# Patient Record
Sex: Female | Born: 1985 | Race: Black or African American | Hispanic: No | Marital: Single | Smoking: Current every day smoker
Health system: Southern US, Community
[De-identification: ages and names within clinical notes are randomized; demographics above are authoritative.]

## PROBLEM LIST (undated history)

## (undated) DIAGNOSIS — I10 Essential (primary) hypertension: Secondary | ICD-10-CM

## (undated) DIAGNOSIS — C50919 Malignant neoplasm of unspecified site of unspecified female breast: Secondary | ICD-10-CM

## (undated) DIAGNOSIS — I639 Cerebral infarction, unspecified: Secondary | ICD-10-CM

## (undated) HISTORY — PX: PORTACATH PLACEMENT: SHX2246

## (undated) HISTORY — PX: ABDOMINAL HYSTERECTOMY: SHX81

---

## 2017-08-10 ENCOUNTER — Emergency Department
Admission: EM | Admit: 2017-08-10 | Discharge: 2017-08-10 | Disposition: A | Discharge status: Home / Self Care | Payer: Medicaid - Out of State | Attending: Emergency Medicine | Admitting: Emergency Medicine

## 2017-08-10 ENCOUNTER — Encounter: Payer: Self-pay | Admitting: Emergency Medicine

## 2017-08-10 ENCOUNTER — Emergency Department: Payer: Medicaid - Out of State

## 2017-08-10 ENCOUNTER — Other Ambulatory Visit: Payer: Self-pay

## 2017-08-10 DIAGNOSIS — Z853 Personal history of malignant neoplasm of breast: Secondary | ICD-10-CM | POA: Diagnosis not present

## 2017-08-10 DIAGNOSIS — N3001 Acute cystitis with hematuria: Secondary | ICD-10-CM | POA: Diagnosis not present

## 2017-08-10 DIAGNOSIS — C50919 Malignant neoplasm of unspecified site of unspecified female breast: Secondary | ICD-10-CM | POA: Insufficient documentation

## 2017-08-10 DIAGNOSIS — F17210 Nicotine dependence, cigarettes, uncomplicated: Secondary | ICD-10-CM | POA: Insufficient documentation

## 2017-08-10 DIAGNOSIS — R531 Weakness: Secondary | ICD-10-CM | POA: Diagnosis present

## 2017-08-10 HISTORY — DX: Malignant neoplasm of unspecified site of unspecified female breast: C50.919

## 2017-08-10 LAB — DIFFERENTIAL
Basophils Absolute: 0 10*3/uL (ref 0–0.1)
Basophils Relative: 0 %
Eosinophils Absolute: 0.2 10*3/uL (ref 0–0.7)
Eosinophils Relative: 1 %
LYMPHS ABS: 1.2 10*3/uL (ref 1.0–3.6)
LYMPHS PCT: 11 %
MONO ABS: 0.2 10*3/uL (ref 0.2–0.9)
Monocytes Relative: 2 %
NEUTROS PCT: 86 %
Neutro Abs: 9.9 10*3/uL — ABNORMAL HIGH (ref 1.4–6.5)

## 2017-08-10 LAB — URINALYSIS, COMPLETE (UACMP) WITH MICROSCOPIC
Bacteria, UA: NONE SEEN
Bilirubin Urine: NEGATIVE
Glucose, UA: NEGATIVE mg/dL
Ketones, ur: NEGATIVE mg/dL
Nitrite: NEGATIVE
PH: 7 (ref 5.0–8.0)
Protein, ur: NEGATIVE mg/dL
SPECIFIC GRAVITY, URINE: 1.02 (ref 1.005–1.030)

## 2017-08-10 LAB — CBC
HCT: 30.6 % — ABNORMAL LOW (ref 35.0–47.0)
Hemoglobin: 10 g/dL — ABNORMAL LOW (ref 12.0–16.0)
MCH: 27.2 pg (ref 26.0–34.0)
MCHC: 32.7 g/dL (ref 32.0–36.0)
MCV: 83 fL (ref 80.0–100.0)
PLATELETS: 191 10*3/uL (ref 150–440)
RBC: 3.69 MIL/uL — ABNORMAL LOW (ref 3.80–5.20)
RDW: 18.6 % — AB (ref 11.5–14.5)
WBC: 11.6 10*3/uL — AB (ref 3.6–11.0)

## 2017-08-10 LAB — COMPREHENSIVE METABOLIC PANEL
ALBUMIN: 4.1 g/dL (ref 3.5–5.0)
ALK PHOS: 68 U/L (ref 38–126)
ALT: 14 U/L (ref 14–54)
AST: 20 U/L (ref 15–41)
Anion gap: 8 (ref 5–15)
BUN: 13 mg/dL (ref 6–20)
CALCIUM: 9.1 mg/dL (ref 8.9–10.3)
CO2: 24 mmol/L (ref 22–32)
Chloride: 106 mmol/L (ref 101–111)
Creatinine, Ser: 0.53 mg/dL (ref 0.44–1.00)
GFR calc Af Amer: >60 mL/min (ref >60)
GFR calc non Af Amer: >60 mL/min (ref >60)
GLUCOSE: 100 mg/dL — AB (ref 65–99)
Potassium: 3.5 mmol/L (ref 3.5–5.1)
Sodium: 138 mmol/L (ref 135–145)
Total Bilirubin: 0.8 mg/dL (ref 0.3–1.2)
Total Protein: 7 g/dL (ref 6.5–8.1)

## 2017-08-10 LAB — POCT PREGNANCY, URINE: Preg Test, Ur: NEGATIVE

## 2017-08-10 LAB — INFLUENZA PANEL BY PCR (TYPE A & B)
Influenza A By PCR: NEGATIVE
Influenza B By PCR: NEGATIVE

## 2017-08-10 MED ORDER — OXYCODONE-ACETAMINOPHEN 5-325 MG PO TABS: 1.0000 | ORAL_TABLET | Freq: Four times a day (QID) | ORAL | 0 refills | Status: AC | PRN

## 2017-08-10 MED ORDER — CEPHALEXIN 500 MG PO CAPS: 500.0000 mg | ORAL_CAPSULE | Freq: Three times a day (TID) | ORAL | 0 refills | Status: AC

## 2017-08-10 MED ORDER — OXYCODONE HCL 5 MG PO TABS
5.0000 mg | ORAL_TABLET | Freq: Once | ORAL | Status: AC
  Administered 2017-08-10: 5 mg via ORAL
  Filled 2017-08-10: qty 1

## 2017-08-10 MED ORDER — SODIUM CHLORIDE 0.9 % IV BOLUS (SEPSIS)
500.0000 mL | Freq: Once | INTRAVENOUS | Status: AC
  Administered 2017-08-10: 500 mL via INTRAVENOUS

## 2017-08-10 MED ORDER — ACETAMINOPHEN 500 MG PO TABS
1000.0000 mg | ORAL_TABLET | Freq: Once | ORAL | Status: DC
  Filled 2017-08-10: qty 2

## 2017-08-10 MED ORDER — KETOROLAC TROMETHAMINE 30 MG/ML IJ SOLN
15.0000 mg | Freq: Once | INTRAMUSCULAR | Status: AC
  Administered 2017-08-10: 15 mg via INTRAVENOUS
  Filled 2017-08-10: qty 1

## 2017-08-10 MED ORDER — CEFTRIAXONE SODIUM IN DEXTROSE 20 MG/ML IV SOLN
1.0000 g | Freq: Once | INTRAVENOUS | Status: AC
  Administered 2017-08-10: 1 g via INTRAVENOUS
  Filled 2017-08-10: qty 50

## 2017-08-10 NOTE — ED Provider Notes (Signed)
Not neutropenic.  Discharged according to Dr. Doristine Johns plan.   Darel Hong, MD 08/10/17 2045

## 2017-08-10 NOTE — ED Notes (Signed)
Pt reports being a 10/10 pain. No distress noted. Pt states her abdomen is hurting now. Advised pt not to eat anything else. MD updated.

## 2017-08-10 NOTE — ED Notes (Signed)
Pt requesting to be dc.

## 2017-08-10 NOTE — ED Provider Notes (Signed)
[facility] Emergency Department Provider Note  ____________________________________________  Time seen: Approximately 8:23 PM  I have reviewed the triage vital signs and the nursing notes.   HISTORY  Chief Complaint Weakness   HPI Sharon Copeland is a 32 y.o. female with a history of stage III breast cancer on chemotherapy who presents for evaluation of generalized weakness. Patient reports generalized weakness and body aches. She has had these symptoms since starting chemo in the summer however they got worse yesterday. She is from [facility] and is here visiting a friend. Last chemo was last week. She gets chemo every 2 weeks. No fever, but has had chills. She has a dry cough. No diarrhea, no vomiting, no chest pain, no shortness of breath, no abdominal pain, no dysuria or hematuria. She is complaining that her body aches are severe, constant, nonradiating. She reports in the past that she was given oxycodone by her oncologist however she does not have any with her.  Past Medical History:  Diagnosis Date  . Breast cancer ([place])     There are no active problems to display for this patient.   Past Surgical History:  Procedure Laterality Date  . PORTACATH PLACEMENT      Prior to Admission medications   Medication Sig Start Date End Date Taking? Authorizing Provider  cephALEXin (KEFLEX) 500 MG capsule Take 1 capsule (500 mg total) by mouth 3 (three) times daily for 7 days. 08/10/17 08/17/17  Rudene Re, MD  oxyCODONE-acetaminophen (ROXICET) 5-325 MG tablet Take 1 tablet by mouth every 6 (six) hours as needed. 08/10/17 08/10/18  Rudene Re, MD    Allergies Patient has no known allergies.  No family history on file.  Social History Social History   Tobacco Use  . Smoking status: Current Every Day Smoker    Packs/day: 0.25    Types: Cigarettes  Substance Use Topics  . Alcohol use: Not on file  . Drug use: Not on file    Review of  Systems  Constitutional: Negative for fever. + chills, generalized weakness and body aches Eyes: Negative for visual changes. ENT: Negative for sore throat. Neck: No neck pain  Cardiovascular: Negative for chest pain. Respiratory: Negative for shortness of breath. + cough Gastrointestinal: Negative for abdominal pain, vomiting or diarrhea. Genitourinary: Negative for dysuria. Musculoskeletal: Negative for back pain. Skin: Negative for rash. Neurological: Negative for headaches, weakness or numbness. Psych: No SI or HI  ____________________________________________   PHYSICAL EXAM:  VITAL SIGNS: ED Triage Vitals  Enc Vitals Group     BP 08/10/17 1755 130/85     Pulse Rate 08/10/17 1755 (!) 111     Resp 08/10/17 1755 18     Temp 08/10/17 1755 99.2 F (37.3 C)     Temp Source 08/10/17 1755 Oral     SpO2 08/10/17 1755 100 %     Weight 08/10/17 1756 157 lb (71.2 kg)     Height 08/10/17 1756 5\' 7"  (1.702 m)     Head Circumference --      Peak Flow --      Pain Score 08/10/17 1755 10     Pain Loc --      Pain Edu? --      Excl. in [place]? --     Constitutional: Alert and oriented. Well appearing and in no apparent distress. HEENT:      Head: Normocephalic and atraumatic.         Eyes: Conjunctivae are normal. Sclera is non-icteric.  Mouth/Throat: Mucous membranes are moist. oropharynx is clear      Neck: Supple with no signs of meningismus. Cardiovascular: tachycardic with regular rhythm. No murmurs, gallops, or rubs. 2+ symmetrical distal pulses are present in all extremities. No JVD. Respiratory: Normal respiratory effort. Lungs are clear to auscultation bilaterally. No wheezes, crackles, or rhonchi.  Gastrointestinal: Soft, ttp over the suprapubic region, and non distended with positive bowel sounds. No rebound or guarding. Genitourinary: No CVA tenderness. Musculoskeletal: Nontender with normal range of motion in all extremities. No edema, cyanosis, or erythema of  extremities. Neurologic: Normal speech and language. Face is symmetric. Moving all extremities. No gross focal neurologic deficits are appreciated. Skin: Skin is warm, dry and intact. No rash noted. Psychiatric: Mood and affect are normal. Speech and behavior are normal.  ____________________________________________   LABS (all labs ordered are listed, but only abnormal results are displayed)  Labs Reviewed  CBC - Abnormal; Notable for the following components:      Result Value   WBC 11.6 (*)    RBC 3.69 (*)    Hemoglobin 10.0 (*)    HCT 30.6 (*)    RDW 18.6 (*)    All other components within normal limits  URINALYSIS, COMPLETE (UACMP) WITH MICROSCOPIC - Abnormal; Notable for the following components:   Color, Urine YELLOW (*)    APPearance HAZY (*)    Hgb urine dipstick SMALL (*)    Leukocytes, UA MODERATE (*)    Squamous Epithelial / LPF 6-30 (*)    All other components within normal limits  COMPREHENSIVE METABOLIC PANEL - Abnormal; Notable for the following components:   Glucose, Bld 100 (*)    All other components within normal limits  DIFFERENTIAL - Abnormal; Notable for the following components:   Neutro Abs 9.9 (*)    All other components within normal limits  URINE CULTURE  INFLUENZA PANEL BY PCR (TYPE A & B)  POC URINE PREG, ED  POCT PREGNANCY, URINE   ____________________________________________  EKG  none ____________________________________________  RADIOLOGY  CXR:  No active cardiopulmonary disease. ____________________________________________   PROCEDURES  Procedure(s) performed: None Procedures Critical Care performed:  None ____________________________________________   INITIAL IMPRESSION / ASSESSMENT AND PLAN / ED COURSE   32 y.o. female with a history of stage III breast cancer on chemotherapy who presents for evaluation of generalized weakness, body aches, and cough since yesterday. patient is well-appearing, in no distress, she is  tachycardic with a pulse of 111, her temp is 99.2, lungs are clear, abdomen is soft with mild suprapubic tenderness, labs showing WBC 11.6, flu negative, CXR negative, UA positive for UTI. Patient given rocephin and IVF. Given toradol and oxycodone. Differential pending to eval for neutropenia. If patient neutropenic with positive UA will admit for IV abx if not then she can be dc home on keflex. Will provide short prescription for opiates. Care transferred to Dr. Mable Paris.      As part of my medical decision making, I reviewed the following data within the [place] notes reviewed and incorporated, Labs reviewed , Patient signed out to Dr. Mable Paris, Radiograph reviewed , Notes from prior ED visits and [place] Controlled Substance Database    Pertinent labs & imaging results that were available during my care of the patient were reviewed by me and considered in my medical decision making (see chart for details).    ____________________________________________   FINAL CLINICAL IMPRESSION(S) / ED DIAGNOSES  Final diagnoses:  Acute cystitis with hematuria  Malignant neoplasm of female breast, unspecified estrogen receptor status, unspecified laterality, unspecified site of breast ([place])      NEW MEDICATIONS STARTED DURING THIS VISIT:  ED Discharge Orders        Ordered    oxyCODONE-acetaminophen (ROXICET) 5-325 MG tablet  Every 6 hours PRN     08/10/17 2038    cephALEXin (KEFLEX) 500 MG capsule  3 times daily     08/10/17 2039       Note:  This document was prepared using Dragon voice recognition software and may include unintentional dictation errors.    Rudene Re, MD 08/10/17 2039

## 2017-08-10 NOTE — Discharge Instructions (Signed)

## 2017-08-10 NOTE — ED Notes (Signed)
Verified with Dr Fransisco Hertz that using the peripheral IV was appropriate for meds and fluids. MD approved. Pt states no one ever told her not to have IVs but not to do BP on her arms.
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
## 2017-08-10 NOTE — ED Notes (Signed)
BP cuff moved to right leg due to bilateral mastectomy. Pt given graham crackers and peanut butter per request.

## 2017-08-10 NOTE — ED Triage Notes (Signed)
General weakness and body aches increased dramatically yesterday. Stage 3 breast cancer, has been getting IV chemo x 4 months. No radiation yet. Cough started yesterday.

## 2017-08-10 NOTE — ED Notes (Signed)
Per MD Rifenbark, leave in peripheral IV.

## 2017-08-12 LAB — URINE CULTURE: Culture: NO GROWTH

## 2017-08-29 ENCOUNTER — Other Ambulatory Visit: Payer: Self-pay

## 2017-08-29 ENCOUNTER — Emergency Department: Payer: Medicaid - Out of State

## 2017-08-29 ENCOUNTER — Encounter: Payer: Self-pay | Admitting: Emergency Medicine

## 2017-08-29 ENCOUNTER — Emergency Department
Admission: EM | Admit: 2017-08-29 | Discharge: 2017-08-29 | Disposition: A | Discharge status: Home / Self Care | Payer: Medicaid - Out of State | Attending: Emergency Medicine | Admitting: Emergency Medicine

## 2017-08-29 DIAGNOSIS — Z79899 Other long term (current) drug therapy: Secondary | ICD-10-CM | POA: Diagnosis not present

## 2017-08-29 DIAGNOSIS — C50919 Malignant neoplasm of unspecified site of unspecified female breast: Secondary | ICD-10-CM | POA: Diagnosis not present

## 2017-08-29 DIAGNOSIS — R1013 Epigastric pain: Secondary | ICD-10-CM | POA: Diagnosis present

## 2017-08-29 DIAGNOSIS — K2900 Acute gastritis without bleeding: Secondary | ICD-10-CM

## 2017-08-29 DIAGNOSIS — F17210 Nicotine dependence, cigarettes, uncomplicated: Secondary | ICD-10-CM | POA: Insufficient documentation

## 2017-08-29 DIAGNOSIS — Z95828 Presence of other vascular implants and grafts: Secondary | ICD-10-CM | POA: Insufficient documentation

## 2017-08-29 DIAGNOSIS — M79605 Pain in left leg: Secondary | ICD-10-CM | POA: Insufficient documentation

## 2017-08-29 DIAGNOSIS — M79604 Pain in right leg: Secondary | ICD-10-CM | POA: Diagnosis not present

## 2017-08-29 LAB — COMPREHENSIVE METABOLIC PANEL
ALBUMIN: 4.4 g/dL (ref 3.5–5.0)
ALT: 14 U/L (ref 14–54)
AST: 19 U/L (ref 15–41)
Alkaline Phosphatase: 87 U/L (ref 38–126)
Anion gap: 10 (ref 5–15)
BILIRUBIN TOTAL: 1.3 mg/dL — AB (ref 0.3–1.2)
BUN: 12 mg/dL (ref 6–20)
CHLORIDE: 103 mmol/L (ref 101–111)
CO2: 25 mmol/L (ref 22–32)
Calcium: 9.9 mg/dL (ref 8.9–10.3)
Creatinine, Ser: 0.62 mg/dL (ref 0.44–1.00)
GFR calc Af Amer: >60 mL/min (ref >60)
GFR calc non Af Amer: >60 mL/min (ref >60)
GLUCOSE: 99 mg/dL (ref 65–99)
POTASSIUM: 3.4 mmol/L — AB (ref 3.5–5.1)
SODIUM: 138 mmol/L (ref 135–145)
TOTAL PROTEIN: 8.5 g/dL — AB (ref 6.5–8.1)

## 2017-08-29 LAB — CBC
HEMATOCRIT: 35.8 % (ref 35.0–47.0)
HEMOGLOBIN: 11.5 g/dL — AB (ref 12.0–16.0)
MCH: 26.6 pg (ref 26.0–34.0)
MCHC: 32.2 g/dL (ref 32.0–36.0)
MCV: 82.7 fL (ref 80.0–100.0)
Platelets: 268 10*3/uL (ref 150–440)
RBC: 4.33 MIL/uL (ref 3.80–5.20)
RDW: 19.2 % — AB (ref 11.5–14.5)
WBC: 5.9 10*3/uL (ref 3.6–11.0)

## 2017-08-29 LAB — LIPASE, BLOOD: LIPASE: 22 U/L (ref 11–51)

## 2017-08-29 LAB — TROPONIN I

## 2017-08-29 MED ORDER — ONDANSETRON HCL 4 MG/2ML IJ SOLN
4.0000 mg | Freq: Once | INTRAMUSCULAR | Status: AC
  Administered 2017-08-29: 4 mg via INTRAVENOUS
  Filled 2017-08-29: qty 2

## 2017-08-29 MED ORDER — HYDROCODONE-ACETAMINOPHEN 5-325 MG PO TABS: 1.0000 | ORAL_TABLET | Freq: Four times a day (QID) | ORAL | 0 refills | Status: AC | PRN

## 2017-08-29 MED ORDER — MORPHINE SULFATE (PF) 4 MG/ML IV SOLN
INTRAVENOUS | Status: AC
  Filled 2017-08-29: qty 1

## 2017-08-29 MED ORDER — MORPHINE SULFATE (PF) 4 MG/ML IV SOLN
4.0000 mg | Freq: Once | INTRAVENOUS | Status: AC
  Administered 2017-08-29: 4 mg via INTRAVENOUS
  Filled 2017-08-29: qty 1

## 2017-08-29 MED ORDER — ONDANSETRON HCL 4 MG PO TABS: 4.0000 mg | ORAL_TABLET | Freq: Three times a day (TID) | ORAL | 0 refills | Status: AC | PRN

## 2017-08-29 MED ORDER — ALUM & MAG HYDROXIDE-SIMETH 200-200-20 MG/5ML PO SUSP
30.0000 mL | Freq: Once | ORAL | Status: AC
  Administered 2017-08-29: 30 mL via ORAL
  Filled 2017-08-29: qty 30

## 2017-08-29 MED ORDER — SODIUM CHLORIDE 0.9 % IV BOLUS (SEPSIS)
1000.0000 mL | Freq: Once | INTRAVENOUS | Status: AC
  Administered 2017-08-29: 1000 mL via INTRAVENOUS

## 2017-08-29 MED ORDER — ONDANSETRON HCL 4 MG/2ML IJ SOLN
INTRAMUSCULAR | Status: AC
  Filled 2017-08-29: qty 2

## 2017-08-29 NOTE — ED Triage Notes (Addendum)
Pt presents to ED with c/o abdominal pain, leg pain, and hand pain x 3 days. Pt states reports vomiting x 3 days, denies any today. Pt states pain in her legs worse with walking. Pt is alert and oriented. Skin war, dry, and intact. Respirations even and unlabored. Pt states she is currently receiving chemo for stage 3 breast cancer, last chemo approx 1.5 weeks ago. Pt reports cramping bilaterally to lower legs and hands.

## 2017-08-29 NOTE — ED Notes (Signed)
Patient transported to Ultrasound 

## 2017-08-29 NOTE — Discharge Instructions (Signed)
You are evaluated for upper abdominal pain which as we discussed I suspect may be related to gastritis, you may try taking over-the-counter Maalox as needed for symptomatic relief.  Try to eat small frequent meals.  You are being prescribed a nausea tablet to help with chemo related nausea.  No certain cause was found for lower extremity pain, although it sounds like this is previous to prior pain related to chemotherapy.  You are being prescribed short course of pain medication.  Return to emergency department immediately for any worsening condition including fever, worsening or uncontrolled abdominal pain, vomiting blood, black or bloody stools, dizziness or passing out, skin rash, or any other symptoms concerning to you.

## 2017-08-29 NOTE — ED Triage Notes (Signed)
First Nurse Note:  Arrives with C/O abdominal pain, headache, and leg pain x 3 days.  Also reports vomiting intermittently.   Patient is AAOx3.  Skin warm and dry. NAD

## 2017-08-29 NOTE — ED Provider Notes (Signed)
[facility] Emergency Department Provider Note ____________________________________________   I have reviewed the triage vital signs and the triage nursing note.  HISTORY  Chief Complaint Leg Pain and Abdominal Pain   Historian Patient  HPI Sharon Copeland is a 32 y.o. female being treated by oncology in [place] for breast cancer, last chemo was last month.  Patient states that she often gets chronic pain after the chemotherapy.  She has been dealing with pain in her upper abdomen and also bilateral legs worse for the past couple of days.  This feels very similar to prior episodes.  She at times has been prescribed oxycodone, she does not have any of that right now.  Right now she staying with a friend here in [place].  Denies fever or coughing or chest pain or trouble breathing.  No urinary symptoms.  No lower abdominal symptoms.  Patient states that she has not had ultrasound to check legs for blood clots since she has been having intermittent issues with leg pains and belly pain secondary to chemo over the past several months since the summer when she started chemotherapy for breast cancer.  She does have decreased p.o. intake, and some nausea at home.  She does not currently have nausea medication.  Symptoms are moderate.  Nothing makes it worse or better.   Past Medical History:  Diagnosis Date  . Breast cancer ([place])     There are no active problems to display for this patient.   Past Surgical History:  Procedure Laterality Date  . PORTACATH PLACEMENT      Prior to Admission medications   Medication Sig Start Date End Date Taking? Authorizing Provider  ondansetron (ZOFRAN-ODT) 4 MG disintegrating tablet Take 4 mg by mouth every 4 (four) hours as needed. 08/18/17  Yes [provider]  oxyCODONE-acetaminophen (ROXICET) 5-325 MG tablet Take 1 tablet by mouth every 6 (six) hours as needed. 08/10/17 08/10/18 Yes [place], [place],  MD  HYDROcodone-acetaminophen (NORCO/VICODIN) 5-325 MG tablet Take 1-2 tablets by mouth every 6 (six) hours as needed for moderate pain. 08/29/17   Lisa Roca, MD  ondansetron (ZOFRAN) 4 MG tablet Take 1 tablet (4 mg total) by mouth every 8 (eight) hours as needed for nausea or vomiting. 08/29/17   Lisa Roca, MD    No Known Allergies  History reviewed. No pertinent family history.  Social History Social History   Tobacco Use  . Smoking status: Current Every Day Smoker    Packs/day: 0.25    Types: Cigarettes  . Smokeless tobacco: Never Used  Substance Use Topics  . Alcohol use: No    Frequency: Never  . Drug use: No    Review of Systems  Constitutional: Negative for fever. Eyes: Negative for visual changes. ENT: Negative for sore throat. Cardiovascular: Negative for chest pain. Respiratory: Negative for shortness of breath. Gastrointestinal: Positive for decreased p.o. intake, upper abdominal pain, no vomiting or diarrhea.. Genitourinary: Negative for dysuria. Musculoskeletal: Negative for back pain. Skin: Negative for rash. Neurological: Negative for headache.  ____________________________________________   PHYSICAL EXAM:  VITAL SIGNS: ED Triage Vitals  Enc Vitals Group     BP 08/29/17 1217 127/74     Pulse Rate 08/29/17 1217 (!) 111     Resp 08/29/17 1217 18     Temp 08/29/17 1217 98.7 F (37.1 C)     Temp Source 08/29/17 1217 Oral     SpO2 08/29/17 1217 100 %     Weight 08/29/17 1218 157 lb (71.2  kg)     Height 08/29/17 1218 5\' 7"  (1.702 m)     Head Circumference --      Peak Flow --      Pain Score --      Pain Loc --      Pain Edu? --      Excl. in [place]? --      Constitutional: Alert and oriented. Well appearing and in no distress. HEENT   Head: Normocephalic and atraumatic.      Eyes: Conjunctivae are normal. Pupils equal and round.       Ears:         Nose: No congestion/rhinnorhea.   Mouth/Throat: Mucous membranes are mildly dry.    Neck: No stridor. Cardiovascular/Chest: Tachycardic rate, regular rhythm.  No murmurs, rubs, or gallops. Respiratory: Normal respiratory effort without tachypnea nor retractions. Breath sounds are clear and equal bilaterally. No wheezes/rales/rhonchi. Gastrointestinal: Soft. No distention, no guarding, no rebound.  Mild epigastric discomfort.  No lower abdominal tenderness to palpation. Genitourinary/rectal:Deferred Musculoskeletal: Nontender with normal range of motion in all extremities. No joint effusions.  Soft tissue leg tenderness especially thigh and calf.  There is no edema.  No unilateral swelling. Neurologic:  Normal speech and language. No gross or focal neurologic deficits are appreciated. Skin:  Skin is warm, dry and intact. No rash noted. Psychiatric: Mood and affect are normal. Speech and behavior are normal. Patient exhibits appropriate insight and judgment.   ____________________________________________  LABS (pertinent positives/negatives) I, Lisa Roca, MD the attending physician have reviewed the labs noted below.  Labs Reviewed  COMPREHENSIVE METABOLIC PANEL - Abnormal; Notable for the following components:      Result Value   Potassium 3.4 (*)    Total Protein 8.5 (*)    Total Bilirubin 1.3 (*)    All other components within normal limits  CBC - Abnormal; Notable for the following components:   Hemoglobin 11.5 (*)    RDW 19.2 (*)    All other components within normal limits  LIPASE, BLOOD  TROPONIN I  URINALYSIS, COMPLETE (UACMP) WITH MICROSCOPIC  POC URINE PREG, ED    ____________________________________________    EKG I, Lisa Roca, MD, the attending physician have personally viewed and interpreted all ECGs.  110 bpm.  Sinus tachycardia.  Narrow QRS.  Normal axis.  Nonspecific ST and T wave ____________________________________________  RADIOLOGY All Xrays were viewed by me.  Imaging interpreted by Radiologist, and I, Lisa Roca, MD the  attending physician have reviewed the radiologist interpretation noted below.  Ultrasound lower extremity venous Doppler:  IMPRESSION: No evidence of deep venous thrombosis in the lower extremities. __________________________________________  PROCEDURES  Procedure(s) performed: None  Critical Care performed: None   ____________________________________________  ED COURSE / ASSESSMENT AND PLAN  Pertinent labs & imaging results that were available during my care of the patient were reviewed by me and considered in my medical decision making (see chart for details).    Patient presents here for treatment of pain which is located on lower extremities as well as epigastrium.  Patient states this is similar to prior episodes that she has had which she felt like was secondary to cancer chemotherapy minutes.  No vomiting blood.  Abdominal pain seems to be in epigastrium, may be gastritis related given that she is not really eating and drinking very well.  EKG is overall reassuring although nonspecific.  I am adding a troponin.  Does not seem like this is lower abdominal related or urinary related.  She is also complaining of leg discomfort, bilaterally, although there is no unilateral swelling, I am going to ultrasound both given history of cancer to rule out any DVT as underlying source of the pain in her legs.  She is found to be slightly tachycardic here.  I am giving her some IV fluids out of concern for mild hypovolemia.  Her initial CBC and metabolic panel are overall reassuring for no significant electrolyte disturbance or acute renal failure.  Sound negative.  On reexamination patient is eating a meal tray.  When asked how she is doing, she states that she still has a little bit of abdominal discomfort.  I asked her if she feels like this is similar to previous, she states that yes she had CT scans before nothing ever shows up.  She states that she believes it secondary to the chemo.   She is feeling much better overall and is prepared to go ahead and be discharged home.  I would give her 1 more dose of IV symptomatic medication here.  We discussed whether or not to evaluate further with CT scan or ultrasound, the patient does not feel like this is any new or different from what she is been dealing with chronically in terms of her cancer/chemo.  Patient care to be transferred to Dr. Alfred Levins at shift change.  Patient awaiting her ride to pick her up.   CONSULTATIONS: None  Patient / Family / Caregiver informed of clinical course, medical decision-making process, and agree with plan.   I discussed return precautions, follow-up instructions, and discharge instructions with patient and/or family.  Discharge Instructions : You are evaluated for upper abdominal pain which as we discussed I suspect may be related to gastritis, you may try taking over-the-counter Maalox as needed for symptomatic relief.  Try to eat small frequent meals.  You are being prescribed a nausea tablet to help with chemo related nausea.  No certain cause was found for lower extremity pain, although it sounds like this is previous to prior pain related to chemotherapy.  You are being prescribed short course of pain medication.  Return to emergency department immediately for any worsening condition including fever, worsening or uncontrolled abdominal pain, vomiting blood, black or bloody stools, dizziness or passing out, skin rash, or any other symptoms concerning to you.    ___________________________________________   FINAL CLINICAL IMPRESSION(S) / ED DIAGNOSES   Final diagnoses:  Other acute gastritis, presence of bleeding unspecified  Pain in both lower extremities      ___________________________________________        Note: This dictation was prepared with Dragon dictation. Any transcriptional errors that result from this process are unintentional    Lisa Roca, MD 08/29/17  1521

## 2019-12-01 ENCOUNTER — Emergency Department (HOSPITAL_COMMUNITY): Payer: Medicaid - Out of State

## 2019-12-01 ENCOUNTER — Encounter: Payer: Self-pay | Admitting: *Deleted

## 2019-12-01 ENCOUNTER — Encounter (HOSPITAL_COMMUNITY): Payer: Self-pay | Admitting: Emergency Medicine

## 2019-12-01 ENCOUNTER — Emergency Department (HOSPITAL_COMMUNITY)
Admission: EM | Admit: 2019-12-01 | Discharge: 2019-12-01 | Discharge status: Against Medical Advice | Payer: Medicaid - Out of State | Attending: Emergency Medicine | Admitting: Emergency Medicine

## 2019-12-01 ENCOUNTER — Emergency Department (HOSPITAL_COMMUNITY)
Admission: EM | Admit: 2019-12-01 | Discharge: 2019-12-01 | Disposition: A | Discharge status: Home / Self Care | Payer: Medicaid - Out of State | Source: Home / Self Care | Attending: Emergency Medicine | Admitting: Emergency Medicine

## 2019-12-01 ENCOUNTER — Other Ambulatory Visit: Payer: Self-pay

## 2019-12-01 DIAGNOSIS — E876 Hypokalemia: Secondary | ICD-10-CM | POA: Insufficient documentation

## 2019-12-01 DIAGNOSIS — M546 Pain in thoracic spine: Secondary | ICD-10-CM | POA: Insufficient documentation

## 2019-12-01 DIAGNOSIS — F17210 Nicotine dependence, cigarettes, uncomplicated: Secondary | ICD-10-CM | POA: Insufficient documentation

## 2019-12-01 DIAGNOSIS — Y999 Unspecified external cause status: Secondary | ICD-10-CM | POA: Insufficient documentation

## 2019-12-01 DIAGNOSIS — S70352A Superficial foreign body, left thigh, initial encounter: Secondary | ICD-10-CM | POA: Insufficient documentation

## 2019-12-01 DIAGNOSIS — Z8673 Personal history of transient ischemic attack (TIA), and cerebral infarction without residual deficits: Secondary | ICD-10-CM | POA: Insufficient documentation

## 2019-12-01 DIAGNOSIS — Z23 Encounter for immunization: Secondary | ICD-10-CM | POA: Insufficient documentation

## 2019-12-01 DIAGNOSIS — Y92410 Unspecified street and highway as the place of occurrence of the external cause: Secondary | ICD-10-CM | POA: Insufficient documentation

## 2019-12-01 DIAGNOSIS — Z853 Personal history of malignant neoplasm of breast: Secondary | ICD-10-CM | POA: Insufficient documentation

## 2019-12-01 DIAGNOSIS — Y9389 Activity, other specified: Secondary | ICD-10-CM | POA: Insufficient documentation

## 2019-12-01 DIAGNOSIS — M545 Low back pain: Secondary | ICD-10-CM | POA: Insufficient documentation

## 2019-12-01 DIAGNOSIS — I10 Essential (primary) hypertension: Secondary | ICD-10-CM | POA: Insufficient documentation

## 2019-12-01 DIAGNOSIS — M542 Cervicalgia: Secondary | ICD-10-CM | POA: Insufficient documentation

## 2019-12-01 DIAGNOSIS — M549 Dorsalgia, unspecified: Secondary | ICD-10-CM

## 2019-12-01 DIAGNOSIS — M795 Residual foreign body in soft tissue: Secondary | ICD-10-CM

## 2019-12-01 DIAGNOSIS — Z5321 Procedure and treatment not carried out due to patient leaving prior to being seen by health care provider: Secondary | ICD-10-CM | POA: Insufficient documentation

## 2019-12-01 HISTORY — DX: Cerebral infarction, unspecified: I63.9

## 2019-12-01 HISTORY — DX: Essential (primary) hypertension: I10

## 2019-12-01 LAB — I-STAT CHEM 8, ED
BUN: 5 mg/dL — ABNORMAL LOW (ref 6–20)
Calcium, Ion: 1.19 mmol/L (ref 1.15–1.40)
Chloride: 101 mmol/L (ref 98–111)
Creatinine, Ser: 0.6 mg/dL (ref 0.44–1.00)
Glucose, Bld: 93 mg/dL (ref 70–99)
HCT: 34 % — ABNORMAL LOW (ref 36.0–46.0)
Hemoglobin: 11.6 g/dL — ABNORMAL LOW (ref 12.0–15.0)
Potassium: 3.1 mmol/L — ABNORMAL LOW (ref 3.5–5.1)
Sodium: 141 mmol/L (ref 135–145)
TCO2: 31 mmol/L (ref 22–32)

## 2019-12-01 MED ORDER — TETANUS-DIPHTH-ACELL PERTUSSIS 5-2.5-18.5 LF-MCG/0.5 IM SUSP
0.5000 mL | Freq: Once | INTRAMUSCULAR | Status: AC
  Administered 2019-12-01: 21:00:00 0.5 mL via INTRAMUSCULAR
  Filled 2019-12-01: qty 0.5

## 2019-12-01 MED ORDER — POTASSIUM CHLORIDE CRYS ER 20 MEQ PO TBCR
40.0000 meq | EXTENDED_RELEASE_TABLET | Freq: Once | ORAL | Status: DC
  Filled 2019-12-01: qty 2

## 2019-12-01 MED ORDER — HYDROCODONE-ACETAMINOPHEN 5-325 MG PO TABS
1.0000 | ORAL_TABLET | Freq: Once | ORAL | Status: AC
  Administered 2019-12-01: 21:00:00 1 via ORAL
  Filled 2019-12-01: qty 1

## 2019-12-01 MED ORDER — CYCLOBENZAPRINE HCL 10 MG PO TABS: 10.0000 mg | ORAL_TABLET | Freq: Two times a day (BID) | ORAL | 0 refills | Status: AC | PRN
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
## 2019-12-01 NOTE — ED Notes (Signed)
Patient walked out of the ED  And left her purse in room.

## 2019-12-01 NOTE — ED Notes (Signed)
Returned from outside.

## 2019-12-01 NOTE — ED Triage Notes (Signed)
Patient is talking on phone at present
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
## 2019-12-01 NOTE — ED Notes (Signed)
Not in room, per registration she stated she had an oncology appointment and had to leave
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
## 2019-12-01 NOTE — ED Notes (Signed)
In Rad
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
## 2022-09-10 DEATH — deceased
